# Patient Record
Sex: Female | Born: 1970 | Race: Black or African American | Hispanic: No | Marital: Married | Smoking: Never smoker
Health system: Southern US, Community
[De-identification: ages and names within clinical notes are randomized; demographics above are authoritative.]

## PROBLEM LIST (undated history)

## (undated) DIAGNOSIS — E119 Type 2 diabetes mellitus without complications: Secondary | ICD-10-CM

## (undated) HISTORY — DX: Type 2 diabetes mellitus without complications: E11.9

---

## 2008-05-16 ENCOUNTER — Ambulatory Visit (HOSPITAL_COMMUNITY): Admission: RE | Admit: 2008-05-16 | Discharge: 2008-05-16 | Payer: Self-pay | Admitting: Specialist

## 2008-06-13 ENCOUNTER — Ambulatory Visit (HOSPITAL_COMMUNITY): Admission: RE | Admit: 2008-06-13 | Discharge: 2008-06-13 | Payer: Self-pay | Admitting: Specialist

## 2008-06-24 ENCOUNTER — Ambulatory Visit (HOSPITAL_COMMUNITY): Admission: RE | Admit: 2008-06-24 | Discharge: 2008-06-24 | Payer: Self-pay | Admitting: Specialist

## 2008-07-14 ENCOUNTER — Ambulatory Visit (HOSPITAL_COMMUNITY): Admission: RE | Admit: 2008-07-14 | Discharge: 2008-07-14 | Payer: Self-pay | Admitting: Specialist

## 2008-08-05 ENCOUNTER — Ambulatory Visit (HOSPITAL_COMMUNITY): Admission: RE | Admit: 2008-08-05 | Discharge: 2008-08-05 | Payer: Self-pay | Admitting: Specialist

## 2008-09-06 ENCOUNTER — Ambulatory Visit (HOSPITAL_COMMUNITY): Admission: RE | Admit: 2008-09-06 | Discharge: 2008-09-06 | Payer: Self-pay | Admitting: Specialist

## 2008-09-27 ENCOUNTER — Ambulatory Visit (HOSPITAL_COMMUNITY): Admission: RE | Admit: 2008-09-27 | Discharge: 2008-09-27 | Payer: Self-pay | Admitting: Specialist

## 2008-10-18 ENCOUNTER — Ambulatory Visit (HOSPITAL_COMMUNITY): Admission: RE | Admit: 2008-10-18 | Discharge: 2008-10-18 | Payer: Self-pay | Admitting: Specialist

## 2008-11-08 ENCOUNTER — Ambulatory Visit (HOSPITAL_COMMUNITY): Admission: RE | Admit: 2008-11-08 | Discharge: 2008-11-08 | Payer: Self-pay | Admitting: Specialist

## 2008-11-15 ENCOUNTER — Ambulatory Visit (HOSPITAL_COMMUNITY): Admission: RE | Admit: 2008-11-15 | Discharge: 2008-11-15 | Payer: Self-pay | Admitting: Specialist

## 2010-08-05 ENCOUNTER — Encounter: Payer: Self-pay | Admitting: Specialist

## 2010-11-27 NOTE — Letter (Signed)
June 13, 2008    Linda B. Wiliam Ke, MD  Linda Sandoval, Linda Sandoval.   RE:   Linda Sandoval, Linda Sandoval  MR#   [id number]  ACC#        [id number]   MFM CONSULTATION REPORT   Dear Dr. Wiliam Sandoval,   I had the pleasure of seeing your patient, Linda Sandoval, for Maternal-  Fetal Medicine consultation.  As you are aware, Linda Sandoval is a 40-year-  old gravida 5, para 1-0-3-1 at 15 weeks and 1-day gestation based on  alpha-feto confirmed by 8-week ultrasound.  As you are also aware, Ms.  Sandoval has a history of class B diabetes.  She was diagnosed with  diabetes approximately 9 years ago and reports good control of her blood  sugars prior to pregnancy with Amaryl and Actos.  Since becoming  pregnant, Linda Sandoval has been treated with a combination of glyburide and  insulin.  She has experienced significant hyperglycemic episodes on this  regimen.  She required hospitalization twice as far in the pregnancy.  Linda Sandoval reports initially being treated with glyburide alone 5 mg  twice daily, but stated that her blood sugars were consistently elevated  on this regimen and she was subsequently treated with N and R, was  subsequently treated with NPH insulin 24 units in the morning, 8 units  in the evening, and regular insulin 10 units in the morning and 8 units  in the evening with significant fluctuations in her sugars and again  significant hyperglycemia.   Linda Sandoval denies any other significant medical or surgical history.  She  has discontinued all of her diabetic medications at the present time and  is taking only prenatal vitamins.  She does not smoke, use alcohol, or  street drugs.   Her obstetrical history includes a term spontaneous vaginal delivery in  1992 of an 8 pounds 8 ounce infant.  In 2004, she had elective pregnancy  termination.  In 2007 and again in 2008, she had a first-trimester  spontaneous abortion, neither of these required treatment with D and C.   Linda Sandoval denies a history of abnormal  Pap smears or STDs.  She has a  hemoglobin A1c that was 5.8 on April 29, 2008.  Additionally, her  baseline 24-hour urine is minimally increased at 211 mg per 24 hours.  She had a normal ophthalmologic exam 1 year ago, and is scheduled to  have a baseline EKG.  Her TSH was also within normal limits on June 01, 2008.   Review of systems was performed today and was negative with the  exception that Linda Sandoval reported significant symptoms of depression and  anxiety.  She was tearful during the exam and did report that she had  had suicidal thoughts over the past week.  She reports a history of  episodes of depression in the past, but has never been treated or  received care for these conditions.   On physical exam, Linda Sandoval was tearful.  Her blood pressure was 129/75,  her pulse was 79 beats per minute, and her weight was 233 pounds.   Her blood sugar log was reviewed and since discontinuing all of her  diabetic medications on June 08, 2008, her fasting blood sugars have  ranged from 109-133.  Additionally, her 2-hour post prenatal blood  sugars ranged from 114-209.  She does have some values in the range of  98.  She does have some values that are in the 80s.   Given Linda Sandoval's  significant symptoms of depression today with  accompanying suicidal ideation, hospitalization was recommended.  She  requested that it will be done at her local hospital.  This was  discussed with Dr. Wiliam Sandoval.  As we discussed, she will present today with  her husband for inpatient evaluation of her psychiatric problem.  Given  that her blood sugars remained mildly elevated, we would recommend a  very small dose of glyburide consisting of 2.5 mg each morning to start.  I suspect that some of her low blood sugars may be due to decreased oral  intake due to her depression.  She states she has not eaten anything  today.  She also is interested in dietary consult seeking care further  recommendations on  how to compose and to cut down of her meals.  Discussed began as an inpatient.   Linda Sandoval is also of advanced maternal age and is scheduled back in our  office on June 24, 2008, for ultrasound and possible amniocentesis  for determination of fetal karyotype.   We expect that Linda Sandoval will need ongoing adjustments to her diabetic  medications and glyburide may not suffice with the entire pregnancy.  However, given her current psychiatric symptoms, frequent hyperglycemia,  and poor eating, again a small dose of glyburide may be the best choice  for her at this time.  We would be happy to comanage Linda Sandoval's  diabetes with you and we will reevaluate her blood sugars at her  followup appointment next week.  We can also suggest some outpatient  providers for psychiatric care when she has been stabilized.  We will  make her an appointment with Dr. Sherald Hess, a psychiatrist, in  [facility] for followup when she is discharged.  We have made a  personal appointment with Dr. Sherald Hess in 3-4 weeks for an  outpatient evaluation.  We look forward to seeing Linda Sandoval back.   Thank you for allowing me to participate in her care.  Please feel free  to contact us at any time regarding this or any other patient you may  have.   Sincerely,      Heather L. Rachel Bo, MD  Maternal Fetal Medicine  [facility]     HLM/MEDQ  D:  06/13/2008  T:  06/14/2008  Job:  [id number]

## 2020-11-15 ENCOUNTER — Encounter: Payer: Self-pay | Admitting: Nurse Practitioner

## 2020-11-15 ENCOUNTER — Other Ambulatory Visit: Payer: Self-pay

## 2020-11-15 ENCOUNTER — Ambulatory Visit: Payer: BC Managed Care – PPO | Admitting: Nurse Practitioner

## 2020-11-15 VITALS — BP 118/81 | HR 74 | Ht 67.0 in | Wt 245.0 lb

## 2020-11-15 DIAGNOSIS — Z794 Long term (current) use of insulin: Secondary | ICD-10-CM

## 2020-11-15 DIAGNOSIS — I10 Essential (primary) hypertension: Secondary | ICD-10-CM | POA: Diagnosis not present

## 2020-11-15 DIAGNOSIS — E1165 Type 2 diabetes mellitus with hyperglycemia: Secondary | ICD-10-CM | POA: Diagnosis not present

## 2020-11-15 DIAGNOSIS — E782 Mixed hyperlipidemia: Secondary | ICD-10-CM

## 2020-11-15 LAB — POCT GLYCOSYLATED HEMOGLOBIN (HGB A1C): Hemoglobin A1C: 13 % — AB (ref 4.0–5.6)

## 2020-11-15 MED ORDER — METFORMIN HCL ER 500 MG PO TB24: 1000.0000 mg | ORAL_TABLET | Freq: Every day | ORAL | 3 refills | Status: DC

## 2020-11-15 NOTE — Progress Notes (Signed)
Endocrinology Consult Note       11/15/2020, 4:01 PM   Subjective:    Patient ID: Linda Sandoval, female    DOB: [birth year].  Linda Sandoval is being seen in consultation for management of currently uncontrolled symptomatic diabetes requested by  Aggie Cosieresai, Balaji, MD.   Past Medical History:  Diagnosis Date  . Diabetes mellitus, type II [facility](HCC)     Past Surgical History:  Procedure Laterality Date  . PARTIAL HYSTERECTOMY      Social History   Socioeconomic History  . Marital status: Married    Spouse name: Not on file  . Number of children: Not on file  . Years of education: Not on file  . Highest education level: Not on file  Occupational History  . Not on file  Tobacco Use  . Smoking status: Never Smoker  . Smokeless tobacco: Never Used  Substance and Sexual Activity  . Alcohol use: Never  . Drug use: Never  . Sexual activity: Not on file  Other Topics Concern  . Not on file  Social History Narrative  . Not on file   Social Determinants of Health   Financial Resource Strain: Not on file  Food Insecurity: Not on file  Transportation Needs: Not on file  Physical Activity: Not on file  Stress: Not on file  Social Connections: Not on file    Family History  Problem Relation Age of Onset  . Diabetes Mother   . Hypertension Father   . Cancer Father   . Hypertension Sister     Outpatient Encounter Medications as of 11/15/2020  Medication Sig  . Ascorbic Acid (VITAMIN C PO) Take by mouth daily.  . B Complex Vitamins (VITAMIN B COMPLEX PO) Take by mouth daily.  . metFORMIN (GLUCOPHAGE-XR) 500 MG 24 hr tablet Take 2 tablets (1,000 mg total) by mouth daily with breakfast.  . sitaGLIPtin (JANUVIA) 100 MG tablet Take by mouth daily.  Marland Kitchen. VITAMIN D PO Take by mouth daily.  Marland Kitchen. glimepiride (AMARYL) 2 MG tablet Take 2 mg by mouth every morning.   No facility-administered encounter medications on  file as of 11/15/2020.    ALLERGIES: Allergies  Allergen Reactions  . Penicillins Rash    VACCINATION STATUS:  There is no immunization history on file for this patient.  Diabetes She presents for her initial diabetic visit. She has type 2 diabetes mellitus. Onset time: Was diagnosed at approx age of 50. Her disease course has been worsening. There are no hypoglycemic associated symptoms. Associated symptoms include blurred vision, fatigue, polydipsia, polyphagia and polyuria. There are no hypoglycemic complications. Symptoms are worsening. There are no diabetic complications. Risk factors for coronary artery disease include diabetes mellitus, dyslipidemia, family history, obesity, hypertension and sedentary lifestyle. Current diabetic treatment includes oral agent (dual therapy). She is compliant with treatment most of the time. Her weight is stable. She is following a generally unhealthy diet. When asked about meal planning, she reported none. She has not had a previous visit with a dietitian. She rarely participates in exercise. (She presents today for her consultation with no meter or logs to review.  Her POCT A1c today is 13%.  She reports she  intermittently monitors glucose 1-2 times daily but she knows she does not check it as often as she should.  She admits to drinking sugar free water additives and does not have a routine eating pattern.  She admits to frequent snacking and does not engage in routine physical activity.  She reports she has been under great amount of stress lately.  She denies any s/s of hypoglycemia.) An ACE inhibitor/angiotensin II receptor blocker is not being taken. She does not see a podiatrist.Eye exam is current.     Review of systems  Constitutional: + Minimally fluctuating body weight,  current Body mass index is 38.37 kg/m. , + fatigue, no subjective hyperthermia, no subjective hypothermia Eyes: + blurry vision, no xerophthalmia ENT: no sore throat, no nodules  palpated in throat, no dysphagia/odynophagia, no hoarseness Cardiovascular: no chest pain, no shortness of breath, no palpitations, no leg swelling Respiratory: no cough, no shortness of breath Gastrointestinal: no nausea/vomiting/diarrhea Genitourinary: + polyuria Musculoskeletal: no muscle/joint aches Skin: no rashes, no hyperemia Neurological: no tremors, no numbness, no tingling, no dizziness Psychiatric: no depression, no anxiety  Objective:     BP 118/81   Pulse 74   Ht 5\' 7"  (1.702 m)   Wt 245 lb (111.1 kg)   BMI 38.37 kg/m   Wt Readings from Last 3 Encounters:  11/15/20 245 lb (111.1 kg)     BP Readings from Last 3 Encounters:  11/15/20 118/81     Physical Exam- Limited  Constitutional:  Body mass index is 38.37 kg/m. , not in acute distress, normal state of mind Eyes:  EOMI, no exophthalmos Neck: Supple Respiratory: Adequate breathing efforts Musculoskeletal: no gross deformities, strength intact in all four extremities, no gross restriction of joint movements Skin:  no rashes, no hyperemia Neurological: no tremor with outstretched hands    CMP ( most recent) CMP  No results found for: NA, K, CL, CO2, GLUCOSE, BUN, CREATININE, CALCIUM, PROT, ALBUMIN, AST, ALT, ALKPHOS, BILITOT, GFRNONAA, GFRAA   Diabetic Labs (most recent): Lab Results  Component Value Date   HGBA1C 13.0 (A) 11/15/2020     Lipid Panel ( most recent) Lipid Panel  No results found for: CHOL, TRIG, HDL, CHOLHDL, VLDL, LDLCALC, LDLDIRECT, LABVLDL    No results found for: TSH, FREET4         Assessment & Plan:   1) Type 2 diabetes mellitus with hyperglycemia, with long-term current use of insulin (HCC)  She presents today for her consultation with no meter or logs to review.  Her POCT A1c today is 13%.  She reports she intermittently monitors glucose 1-2 times daily but she knows she does not check it as often as she should.  She admits to drinking sugar free water additives and  does not have a routine eating pattern.  She admits to frequent snacking and does not engage in routine physical activity.  She reports she has been under great amount of stress lately.  She denies any s/s of hypoglycemia.  - Linda Sandoval has currently uncontrolled symptomatic type 2 DM since 50 years of age, with most recent A1c of 13 %.   -Recent labs reviewed.  - I had a long discussion with her about the progressive nature of diabetes and the pathology behind its complications. -her diabetes is not currently complicated but she remains at a high risk for more acute and chronic complications which include CAD, CVA, CKD, retinopathy, and neuropathy. These are all discussed in detail with her.  - I  have counseled her on diet  and weight management by adopting a carbohydrate restricted/protein rich diet. Patient is encouraged to switch to unprocessed or minimally processed complex starch and increased protein intake (animal or plant source), fruits, and vegetables. -  she is advised to stick to a routine mealtimes to eat 3 meals a day and avoid unnecessary snacks (to snack only to correct hypoglycemia).   - she acknowledges that there is a room for improvement in her food and drink choices. - Suggestion is made for her to avoid simple carbohydrates  from her diet including Cakes, Sweet Desserts, Ice Cream, Soda (diet and regular), Sweet Tea, Candies, Chips, Cookies, Store Bought Juices, Alcohol in Excess of  1-2 drinks a day, Artificial Sweeteners, Coffee Creamer, and "Sugar-free" Products. This will help patient to have more stable blood glucose profile and potentially avoid unintended weight gain.  - she will be scheduled with Norm Salt, RDN, CDE for diabetes education.  - I have approached her with the following individualized plan to manage  her diabetes and patient agrees:   -she is encouraged to start monitoring glucose 4 times daily, before meals and before bed, to log their  readings on the clinic sheets provided, and bring them to review at follow up appointment in 2 weeks.  - she is warned not to take insulin without proper monitoring per orders. - Adjustment parameters are given to her for hypo and hyperglycemia in writing. - she is encouraged to call clinic for blood glucose levels less than 70 or above 300 mg /dl. - she is advised to continue Januvia 100 mg po daily, therapeutically suitable for patient. -I discussed and increased her Glimepiride to 4 mg po daily (may take 2 of her 2 mg tabs until she depletes her supply).  -I also discussed and reinitiated Metformin 1000 mg ER daily after breakfast.  She was unable to tolerate regular strength form previously.   - she will be considered for change in incretin therapy to injectables as appropriate next visit.  - Specific targets for  A1c;  LDL, HDL,  and Triglycerides were discussed with the patient.  2) Blood Pressure /Hypertension:  her blood pressure is controlled to target without the use of antihypertensive medications.    3) Lipids/Hyperlipidemia:    There is no recent lipid panel to review.  Will recheck lipid panel on subsequent visits.  She is not currently on any lipid lowering agents at this time.    4)  Weight/Diet:  her Body mass index is 38.37 kg/m.  -  clearly complicating her diabetes care.   she is a candidate for weight loss. I discussed with her the fact that loss of 5 - 10% of her  current body weight will have the most impact on her diabetes management.  Exercise, and detailed carbohydrates information provided  -  detailed on discharge instructions.  5) Chronic Care/Health Maintenance: -she is not on ACEI/ARB or Statin medications and is encouraged to initiate and continue to follow up with Ophthalmology, Dentist, Podiatrist at least yearly or according to recommendations, and advised to stay away from smoking. I have recommended yearly flu vaccine and pneumonia vaccine at least every  5 years; moderate intensity exercise for up to 150 minutes weekly; and sleep for at least 7 hours a day.  - she is advised to maintain close follow up with Aggie Cosier, MD for primary care needs, as well as her other providers for optimal and coordinated care.   - Time  spent in this patient care: 60 min, of which > 50% was spent in counseling her about her diabetes and the rest reviewing her blood glucose logs, discussing her hypoglycemia and hyperglycemia episodes, reviewing her current and previous labs/studies (including abstraction from other facilities) and medications doses and developing a long term treatment plan based on the latest standards of care/guidelines; and documenting her care.    Please refer to Patient Instructions for Blood Glucose Monitoring and Insulin/Medications Dosing Guide" in media tab for additional information. Please also refer to "Patient Self Inventory" in the Media tab for reviewed elements of pertinent patient history.  Linda Sandoval participated in the discussions, expressed understanding, and voiced agreement with the above plans.  All questions were answered to her satisfaction. she is encouraged to contact clinic should she have any questions or concerns prior to her return visit.   Follow up plan: - Return in about 2 weeks (around 11/29/2020) for Diabetes F/U, Bring meter and logs, ABI next visit.  Ronny Bacon, [facility] [address] [place], [place] [address] Phone: [phone] Fax: [phone]  11/15/2020, 4:01 PM

## 2020-11-15 NOTE — Patient Instructions (Signed)
Diabetes Mellitus and Nutrition, Adult When you have diabetes, or diabetes mellitus, it is very important to have healthy eating habits because your blood sugar (glucose) levels are greatly affected by what you eat and drink. Eating healthy foods in the right amounts, at about the same times every day, can help you:  Control your blood glucose.  Lower your risk of heart disease.  Improve your blood pressure.  Reach or maintain a healthy weight. What can affect my meal plan? Every person with diabetes is different, and each person has different needs for a meal plan. Your health care provider may recommend that you work with a dietitian to make a meal plan that is best for you. Your meal plan may vary depending on factors such as:  The calories you need.  The medicines you take.  Your weight.  Your blood glucose, blood pressure, and cholesterol levels.  Your activity level.  Other health conditions you have, such as heart or kidney disease. How do carbohydrates affect me? Carbohydrates, also called carbs, affect your blood glucose level more than any other type of food. Eating carbs naturally raises the amount of glucose in your blood. Carb counting is a method for keeping track of how many carbs you eat. Counting carbs is important to keep your blood glucose at a healthy level, especially if you use insulin or take certain oral diabetes medicines. It is important to know how many carbs you can safely have in each meal. This is different for every person. Your dietitian can help you calculate how many carbs you should have at each meal and for each snack. How does alcohol affect me? Alcohol can cause a sudden decrease in blood glucose (hypoglycemia), especially if you use insulin or take certain oral diabetes medicines. Hypoglycemia can be a life-threatening condition. Symptoms of hypoglycemia, such as sleepiness, dizziness, and confusion, are similar to symptoms of having too much  alcohol.  Do not drink alcohol if: ? Your health care provider tells you not to drink. ? You are pregnant, may be pregnant, or are planning to become pregnant.  If you drink alcohol: ? Do not drink on an empty stomach. ? Limit how much you use to:  0-1 drink a day for women.  0-2 drinks a day for men. ? Be aware of how much alcohol is in your drink. In the [place] one drink equals one 12 oz bottle of beer (355 mL), one 5 oz glass of wine (148 mL), or one 1 oz glass of hard liquor (44 mL). ? Keep yourself hydrated with water, diet soda, or unsweetened iced tea.  Keep in mind that regular soda, juice, and other mixers may contain a lot of sugar and must be counted as carbs. What are tips for following this plan? Reading food labels  Start by checking the serving size on the "Nutrition Facts" label of packaged foods and drinks. The amount of calories, carbs, fats, and other nutrients listed on the label is based on one serving of the item. Many items contain more than one serving per package.  Check the total grams (g) of carbs in one serving. You can calculate the number of servings of carbs in one serving by dividing the total carbs by 15. For example, if a food has 30 g of total carbs per serving, it would be equal to 2 servings of carbs.  Check the number of grams (g) of saturated fats and trans fats in one serving. Choose foods that have   a low amount or none of these fats.  Check the number of milligrams (mg) of salt (sodium) in one serving. Most people should limit total sodium intake to less than 2,300 mg per day.  Always check the nutrition information of foods labeled as "low-fat" or "nonfat." These foods may be higher in added sugar or refined carbs and should be avoided.  Talk to your dietitian to identify your daily goals for nutrients listed on the label. Shopping  Avoid buying canned, pre-made, or processed foods. These foods tend to be high in fat, sodium, and added  sugar.  Shop around the outside edge of the grocery store. This is where you will most often find fresh fruits and vegetables, bulk grains, fresh meats, and fresh dairy. Cooking  Use low-heat cooking methods, such as baking, instead of high-heat cooking methods like deep frying.  Cook using healthy oils, such as olive, canola, or sunflower oil.  Avoid cooking with butter, cream, or high-fat meats. Meal planning  Eat meals and snacks regularly, preferably at the same times every day. Avoid going long periods of time without eating.  Eat foods that are high in fiber, such as fresh fruits, vegetables, beans, and whole grains. Talk with your dietitian about how many servings of carbs you can eat at each meal.  Eat 4-6 oz (112-168 g) of lean protein each day, such as lean meat, chicken, fish, eggs, or tofu. One ounce (oz) of lean protein is equal to: ? 1 oz (28 g) of meat, chicken, or fish. ? 1 egg. ?  cup (62 g) of tofu.  Eat some foods each day that contain healthy fats, such as avocado, nuts, seeds, and fish.   What foods should I eat? Fruits Berries. Apples. Oranges. Peaches. Apricots. Plums. Grapes. Mango. Papaya. Pomegranate. Kiwi. Cherries. Vegetables Lettuce. Spinach. Leafy greens, including kale, chard, collard greens, and mustard greens. Beets. Cauliflower. Cabbage. Broccoli. Carrots. Green beans. Tomatoes. Peppers. Onions. Cucumbers. Brussels sprouts. Grains Whole grains, such as whole-wheat or whole-grain bread, crackers, tortillas, cereal, and pasta. Unsweetened oatmeal. Quinoa. Brown or wild rice. Meats and other proteins Seafood. Poultry without skin. Lean cuts of poultry and beef. Tofu. Nuts. Seeds. Dairy Low-fat or fat-free dairy products such as milk, yogurt, and cheese. The items listed above may not be a complete list of foods and beverages you can eat. Contact a dietitian for more information. What foods should I avoid? Fruits Fruits canned with  syrup. Vegetables Canned vegetables. Frozen vegetables with butter or cream sauce. Grains Refined white flour and flour products such as bread, pasta, snack foods, and cereals. Avoid all processed foods. Meats and other proteins Fatty cuts of meat. Poultry with skin. Breaded or fried meats. Processed meat. Avoid saturated fats. Dairy Full-fat yogurt, cheese, or milk. Beverages Sweetened drinks, such as soda or iced tea. The items listed above may not be a complete list of foods and beverages you should avoid. Contact a dietitian for more information. Questions to ask a health care provider  Do I need to meet with a diabetes educator?  Do I need to meet with a dietitian?  What number can I call if I have questions?  When are the best times to check my blood glucose? Where to find more information:  American Diabetes Association: diabetes.org  Academy of Nutrition and Dietetics: www.eatright.org  National Institute of Diabetes and Digestive and Kidney Diseases: www.niddk.nih.gov  Association of Diabetes Care and Education Specialists: www.diabeteseducator.org Summary  It is important to have healthy eating   habits because your blood sugar (glucose) levels are greatly affected by what you eat and drink.  A healthy meal plan will help you control your blood glucose and maintain a healthy lifestyle.  Your health care provider may recommend that you work with a dietitian to make a meal plan that is best for you.  Keep in mind that carbohydrates (carbs) and alcohol have immediate effects on your blood glucose levels. It is important to count carbs and to use alcohol carefully. This information is not intended to replace advice given to you by your health care provider. Make sure you discuss any questions you have with your health care provider. Document Revised: 06/08/2019 Document Reviewed: 06/08/2019 Elsevier Patient Education  2021 Elsevier Inc.  

## 2020-11-30 ENCOUNTER — Ambulatory Visit: Payer: BC Managed Care – PPO | Admitting: Nurse Practitioner

## 2020-12-04 ENCOUNTER — Other Ambulatory Visit: Payer: Self-pay

## 2020-12-04 ENCOUNTER — Ambulatory Visit: Payer: BC Managed Care – PPO | Admitting: Nurse Practitioner

## 2020-12-04 ENCOUNTER — Encounter: Payer: Self-pay | Admitting: Nurse Practitioner

## 2020-12-04 VITALS — BP 146/84 | HR 84 | Ht 67.0 in | Wt 247.0 lb

## 2020-12-04 DIAGNOSIS — E1165 Type 2 diabetes mellitus with hyperglycemia: Secondary | ICD-10-CM | POA: Diagnosis not present

## 2020-12-04 DIAGNOSIS — Z794 Long term (current) use of insulin: Secondary | ICD-10-CM

## 2020-12-04 DIAGNOSIS — E559 Vitamin D deficiency, unspecified: Secondary | ICD-10-CM | POA: Diagnosis not present

## 2020-12-04 DIAGNOSIS — I10 Essential (primary) hypertension: Secondary | ICD-10-CM

## 2020-12-04 DIAGNOSIS — E782 Mixed hyperlipidemia: Secondary | ICD-10-CM

## 2020-12-04 LAB — POCT UA - MICROALBUMIN
Albumin/Creatinine Ratio, Urine, POC: <30
Creatinine, POC: 100 mg/dL
Microalbumin Ur, POC: 10 mg/L

## 2020-12-04 MED ORDER — GLIPIZIDE ER 5 MG PO TB24: 5.0000 mg | ORAL_TABLET | Freq: Every day | ORAL | 3 refills | Status: DC

## 2020-12-04 NOTE — Patient Instructions (Signed)

## 2020-12-04 NOTE — Progress Notes (Signed)
Endocrinology Follow Up Note       12/04/2020, 4:24 PM   Subjective:    Patient ID: Linda Sandoval, female    DOB: [birth year].  Linda Sandoval is being seen in follow up after being seen in consultation for management of currently uncontrolled symptomatic diabetes requested by  Aggie Cosier, MD.   Past Medical History:  Diagnosis Date  . Diabetes mellitus, type II [facility])     Past Surgical History:  Procedure Laterality Date  . PARTIAL HYSTERECTOMY      Social History   Socioeconomic History  . Marital status: Married    Spouse name: Not on file  . Number of children: Not on file  . Years of education: Not on file  . Highest education level: Not on file  Occupational History  . Not on file  Tobacco Use  . Smoking status: Never Smoker  . Smokeless tobacco: Never Used  Vaping Use  . Vaping Use: Never used  Substance and Sexual Activity  . Alcohol use: Never  . Drug use: Never  . Sexual activity: Not on file  Other Topics Concern  . Not on file  Social History Narrative  . Not on file   Social Determinants of Health   Financial Resource Strain: Not on file  Food Insecurity: Not on file  Transportation Needs: Not on file  Physical Activity: Not on file  Stress: Not on file  Social Connections: Not on file    Family History  Problem Relation Age of Onset  . Diabetes Mother   . Hypertension Father   . Cancer Father   . Hypertension Sister     Outpatient Encounter Medications as of 12/04/2020  Medication Sig  . Ascorbic Acid (VITAMIN C PO) Take by mouth daily.  . B Complex Vitamins (VITAMIN B COMPLEX PO) Take by mouth daily.  Marland Kitchen glimepiride (AMARYL) 2 MG tablet Take 2 mg by mouth every morning.  . metFORMIN (GLUCOPHAGE-XR) 500 MG 24 hr tablet Take 2 tablets (1,000 mg total) by mouth daily with breakfast.  . sitaGLIPtin (JANUVIA) 100 MG tablet Take by mouth daily.  Marland Kitchen VITAMIN D PO  Take by mouth daily.   No facility-administered encounter medications on file as of 12/04/2020.    ALLERGIES: Allergies  Allergen Reactions  . Penicillins Rash    VACCINATION STATUS:  There is no immunization history on file for this patient.  Diabetes She presents for her follow-up diabetic visit. She has type 2 diabetes mellitus. Onset time: Was diagnosed at approx age of 50. Her disease course has been improving. There are no hypoglycemic associated symptoms. Associated symptoms include blurred vision, fatigue, polydipsia, polyphagia and polyuria. There are no hypoglycemic complications. Symptoms are improving. There are no diabetic complications. Risk factors for coronary artery disease include diabetes mellitus, dyslipidemia, family history, obesity, hypertension and sedentary lifestyle. Current diabetic treatment includes oral agent (triple therapy). She is compliant with treatment most of the time. Her weight is stable. She is following a generally unhealthy diet. When asked about meal planning, she reported none. She has not had a previous visit with a dietitian. She rarely participates in exercise. Her home blood glucose trend is decreasing steadily.  Her breakfast blood glucose range is generally 140-180 mg/dl. Her lunch blood glucose range is generally 130-140 mg/dl. Her dinner blood glucose range is generally 140-180 mg/dl. Her bedtime blood glucose range is generally 180-200 mg/dl. (She presents today with her meter and logs showing improved, yet still above target fasting and postprandial glycemic profile.  She has tolerated the addition of ER Metformin well.  She did have drop in glucose once, associated with skipping lunch.) An ACE inhibitor/angiotensin II receptor blocker is not being taken. She does not see a podiatrist.Eye exam is current.     Review of systems  Constitutional: + Minimally fluctuating body weight,  current Body mass index is 38.69 kg/m. , + fatigue-improving,  no subjective hyperthermia, no subjective hypothermia Eyes: + blurry vision, no xerophthalmia ENT: no sore throat, no nodules palpated in throat, no dysphagia/odynophagia, no hoarseness Cardiovascular: no chest pain, no shortness of breath, no palpitations, no leg swelling Respiratory: no cough, no shortness of breath Gastrointestinal: no nausea/vomiting/diarrhea Genitourinary: + polyuria-improving Musculoskeletal: no muscle/joint aches Skin: no rashes, no hyperemia Neurological: no tremors, no numbness, no tingling, no dizziness Psychiatric: no depression, no anxiety  Objective:     BP (!) 146/84   Pulse 84   Ht 5\' 7"  (1.702 m)   Wt 247 lb (112 kg)   BMI 38.69 kg/m   Wt Readings from Last 3 Encounters:  12/04/20 247 lb (112 kg)  11/15/20 245 lb (111.1 kg)     BP Readings from Last 3 Encounters:  12/04/20 (!) 146/84  11/15/20 118/81     Physical Exam- Limited  Constitutional:  Body mass index is 38.69 kg/m. , not in acute distress, normal state of mind Eyes:  EOMI, no exophthalmos Neck: Supple Respiratory: Adequate breathing efforts Musculoskeletal: no gross deformities, strength intact in all four extremities, no gross restriction of joint movements Skin:  no rashes, no hyperemia Neurological: no tremor with outstretched hands   POCT ABI Results 12/04/20   Right ABI:  1.11      Left ABI:  1.08  Right leg systolic / diastolic: 162/87 mmHg Left leg systolic / diastolic: 157/90 mmHg  Arm systolic / diastolic: 146/84 mmHG  Detailed report will be scanned into patient chart.    CMP ( most recent) CMP  No results found for: NA, K, CL, CO2, GLUCOSE, BUN, CREATININE, CALCIUM, PROT, ALBUMIN, AST, ALT, ALKPHOS, BILITOT, GFRNONAA, GFRAA   Diabetic Labs (most recent): Lab Results  Component Value Date   HGBA1C 13.0 (A) 11/15/2020     Lipid Panel ( most recent) Lipid Panel  No results found for: CHOL, TRIG, HDL, CHOLHDL, VLDL, LDLCALC, LDLDIRECT, LABVLDL     No results found for: TSH, FREET4         Assessment & Plan:   1) Type 2 diabetes mellitus with hyperglycemia, with long-term current use of insulin (HCC)  She presents today with her meter and logs showing improved, yet still above target fasting and postprandial glycemic profile.  She has tolerated the addition of ER Metformin well.  She did have drop in glucose once, associated with skipping lunch.  - Carman F Baze has currently uncontrolled symptomatic type 2 DM since 50 years of age, with most recent A1c of 13 %.   -Recent labs reviewed.  - I had a long discussion with her about the progressive nature of diabetes and the pathology behind its complications. -her diabetes is not currently complicated but she remains at a high risk for more acute and chronic complications  which include CAD, CVA, CKD, retinopathy, and neuropathy. These are all discussed in detail with her.  - Nutritional counseling repeated at each appointment due to patients tendency to fall back in to old habits.  - The patient admits there is a room for improvement in their diet and drink choices. -  Suggestion is made for the patient to avoid simple carbohydrates from their diet including Cakes, Sweet Desserts / Pastries, Ice Cream, Soda (diet and regular), Sweet Tea, Candies, Chips, Cookies, Sweet Pastries, Store Bought Juices, Alcohol in Excess of 1-2 drinks a day, Artificial Sweeteners, Coffee Creamer, and "Sugar-free" Products. This will help patient to have stable blood glucose profile and potentially avoid unintended weight gain.   - I encouraged the patient to switch to unprocessed or minimally processed complex starch and increased protein intake (animal or plant source), fruits, and vegetables.   - Patient is advised to stick to a routine mealtimes to eat 3 meals a day and avoid unnecessary snacks (to snack only to correct hypoglycemia).  - she will be scheduled with Norm Salt, RDN, CDE for  diabetes education.  - I have approached her with the following individualized plan to manage  her diabetes and patient agrees:   -she is encouraged to continue monitoring blood glucose twice daily, before breakfast and before bed, and to call the clinic if she has readings less than 70 or greater than 300 for 3 tests in a row.  - she is warned not to take insulin without proper monitoring per orders. - Adjustment parameters are given to her for hypo and hyperglycemia in writing.  - she is advised to continue Januvia 100 mg po daily and Glimepiride 4 mg po daily, therapeutically suitable for patient (she is about to run out of Glimepiride and will be switched to Glipizide 5 mg XL daily with breakfast).  -She has tolerated ER Metformin well, she is advised to continue Metformin 1000 mg ER daily with breakfast.   - she will be considered for change in incretin therapy to injectables as appropriate next visit.  - Specific targets for  A1c;  LDL, HDL,  and Triglycerides were discussed with the patient.  2) Blood Pressure /Hypertension:  her blood pressure is not controlled to target today.  She is not on any antihypertensive medications.  She will be considered for low dose ACE/ARB on subsequent visits if BP remains elevated on 3 separate occasions.    3) Lipids/Hyperlipidemia:    There is no recent lipid panel to review.  She is not currently on any lipid lowering agents at this time.  Will recheck lipid panel prior to next visit.  4)  Weight/Diet:  her Body mass index is 38.69 kg/m.  -  clearly complicating her diabetes care.   she is a candidate for weight loss. I discussed with her the fact that loss of 5 - 10% of her  current body weight will have the most impact on her diabetes management.  Exercise, and detailed carbohydrates information provided  -  detailed on discharge instructions.  5) Chronic Care/Health Maintenance: -she is not on ACEI/ARB or Statin medications and is encouraged  to initiate and continue to follow up with Ophthalmology, Dentist, Podiatrist at least yearly or according to recommendations, and advised to stay away from smoking. I have recommended yearly flu vaccine and pneumonia vaccine at least every 5 years; moderate intensity exercise for up to 150 minutes weekly; and sleep for at least 7 hours a day.  - she  is advised to maintain close follow up with Aggie Cosier, MD for primary care needs, as well as her other providers for optimal and coordinated care.     I spent 38 minutes in the care of the patient today including review of labs from CMP, Lipids, Thyroid Function, Hematology (current and previous including abstractions from other facilities); face-to-face time discussing  her blood glucose readings/logs, discussing hypoglycemia and hyperglycemia episodes and symptoms, medications doses, her options of short and long term treatment based on the latest standards of care / guidelines;  discussion about incorporating lifestyle medicine;  and documenting the encounter.    Please refer to Patient Instructions for Blood Glucose Monitoring and Insulin/Medications Dosing Guide"  in media tab for additional information. Please  also refer to " Patient Self Inventory" in the Media  tab for reviewed elements of pertinent patient history.  Lizbeth F Dantuono participated in the discussions, expressed understanding, and voiced agreement with the above plans.  All questions were answered to her satisfaction. she is encouraged to contact clinic should she have any questions or concerns prior to her return visit.   Follow up plan: - Return in about 3 months (around 03/06/2021) for Diabetes F/U with A1c in office, Previsit labs, Bring meter and logs.  Ronny Bacon, [facility] [address] [place], [place] [address] Phone: [phone] Fax: [phone]  12/04/2020, 4:24 PM

## 2020-12-21 ENCOUNTER — Telehealth: Payer: Self-pay | Admitting: Nurse Practitioner

## 2020-12-21 MED ORDER — TRULICITY 1.5 MG/0.5ML ~~LOC~~ SOAJ: 1.5000 mg | SUBCUTANEOUS | 3 refills | Status: DC

## 2020-12-21 NOTE — Telephone Encounter (Signed)
Informed pt .

## 2020-12-21 NOTE — Telephone Encounter (Signed)
Pt is calling and states she was supposed to let us know when she finished her Januvia so that a weekly injection medication could be called in, she is unsure what Alphonzo Lemmings was going to put her on but she has finished the Januvia.   [facility] [address] [place], [place] - [place] [address] [phone] Phone:  [phone]  Fax:  [phone]

## 2021-01-08 ENCOUNTER — Other Ambulatory Visit: Payer: Self-pay

## 2021-01-08 ENCOUNTER — Encounter: Payer: BC Managed Care – PPO | Attending: Nurse Practitioner | Admitting: Nutrition

## 2021-01-08 DIAGNOSIS — IMO0002 Reserved for concepts with insufficient information to code with codable children: Secondary | ICD-10-CM

## 2021-01-08 DIAGNOSIS — E1165 Type 2 diabetes mellitus with hyperglycemia: Secondary | ICD-10-CM | POA: Insufficient documentation

## 2021-01-08 DIAGNOSIS — E118 Type 2 diabetes mellitus with unspecified complications: Secondary | ICD-10-CM | POA: Insufficient documentation

## 2021-01-08 NOTE — Patient Instructions (Addendum)
Goals  Work on meal prepping Cut out snacks Drink 100 oz 6-16 oz bottles  Increase fresh fruits and vegetables. Get AM less than 130 and less than 150 at bedtime. Take medication as prescribed.

## 2021-01-08 NOTE — Progress Notes (Signed)
Medical Nutrition Therapy  Appointment Start time:  [phone]  Appointment End time:  1645  Primary concerns today: Dm Type 2  Referral diagnosis: E11.8, E66.9 Preferred learning style: No preferece. Learning readiness: Ready   NUTRITION ASSESSMENT  Struggles to eat late at night due to schedule.  Anthropometrics  Wt Readings from Last 3 Encounters:  01/08/21 (P) 244 lb (110.7 kg)  12/04/20 247 lb (112 kg)  11/15/20 245 lb (111.1 kg)   Ht Readings from Last 3 Encounters:  01/08/21 (P) 5\' 7"  (1.702 m)  12/04/20 5\' 7"  (1.702 m)  11/15/20 5\' 7"  (1.702 m)   Body mass index is 38.22 kg/m (pended). @BMIFA @ Facility age limit for growth percentiles is 20 years. Facility age limit for growth percentiles is 20 years.    Clinical Medical Hx: Dm Type 2 Medications: Trulicity, Glipizide, Metformin ER 500 mg BID Labs:  Lab Results  Component Value Date   HGBA1C 13.0 (A) 11/15/2020   Notable Signs/Symptoms: Increase thirst, fatigue, increased urination, dry mouth  Lifestyle & Dietary Hx   Estimated daily fluid intake: 32 oz Supplements: VIt C, Vit D Sleep: varieis Stress / self-care: life Current average weekly physical activity: ADL  24-Hr Dietary Recall Eats 1-2 meals per eay  Estimated Energy Needs Calories: 1200 Carbohydrate: 135g Protein: 90g Fat: 33g   NUTRITION DIAGNOSIS  NB-1.1 Food and nutrition-related knowledge deficit As related to Diabetes Type .  As evidenced by A1C 13.8%.   NUTRITION INTERVENTION  Nutrition education (E-1) on the following topics:  Nutrition and Diabetes education provided on My Plate, CHO counting, meal planning, portion sizes, timing of meals, avoiding snacks between meals unless having a low blood sugar, target ranges for A1C and blood sugars, signs/symptoms and treatment of hyper/hypoglycemia, monitoring blood sugars, taking medications as prescribed, benefits of exercising 30 minutes per day and prevention of complications of  DM.   Handouts Provided Include  MyPlate Diabetes Instructions Meal Plan Card  Learning Style & Readiness for Change Teaching method utilized: Visual & Auditory  Demonstrated degree of understanding via: Teach Back  Barriers to learning/adherence to lifestyle change: none  Goals Established by Pt Goals  Work on meal prepping Cut out snacks Drink 100 oz 6-16 oz bottles  Increase fresh fruits and vegetables. Get AM less than 130 and less than 150 at bedtime. Take medication as prescribed.01/15/21   MONITORING & EVALUATION Dietary intake, weekly physical activity, and blood sugars  in 1 month.  Next Steps  Patient is to work on meal planning and eating meals on time. 

## 2021-01-10 ENCOUNTER — Encounter: Payer: Self-pay | Admitting: Nutrition

## 2021-02-08 ENCOUNTER — Ambulatory Visit: Payer: BC Managed Care – PPO | Admitting: Nutrition

## 2021-02-21 ENCOUNTER — Encounter: Payer: BC Managed Care – PPO | Admitting: Nutrition

## 2021-03-07 ENCOUNTER — Ambulatory Visit: Payer: BC Managed Care – PPO | Admitting: Nurse Practitioner

## 2021-03-07 ENCOUNTER — Ambulatory Visit: Payer: BC Managed Care – PPO | Admitting: Nutrition

## 2021-05-26 LAB — COMPREHENSIVE METABOLIC PANEL
ALT: 20 IU/L (ref 0–32)
AST: 24 IU/L (ref 0–40)
Albumin/Globulin Ratio: 1.2 (ref 1.2–2.2)
Albumin: 4 g/dL (ref 3.8–4.8)
Alkaline Phosphatase: 115 IU/L (ref 44–121)
BUN/Creatinine Ratio: 14 (ref 9–23)
BUN: 9 mg/dL (ref 6–24)
Bilirubin Total: 0.4 mg/dL (ref 0.0–1.2)
CO2: 25 mmol/L (ref 20–29)
Calcium: 10 mg/dL (ref 8.7–10.2)
Chloride: 97 mmol/L (ref 96–106)
Creatinine, Ser: 0.66 mg/dL (ref 0.57–1.00)
Globulin, Total: 3.3 g/dL (ref 1.5–4.5)
Glucose: 172 mg/dL — ABNORMAL HIGH (ref 70–99)
Potassium: 5 mmol/L (ref 3.5–5.2)
Sodium: 134 mmol/L (ref 134–144)
Total Protein: 7.3 g/dL (ref 6.0–8.5)
eGFR: 108 mL/min/{1.73_m2} (ref >59)

## 2021-05-26 LAB — LIPID PANEL
Chol/HDL Ratio: 1.6 ratio (ref 0.0–4.4)
Cholesterol, Total: 121 mg/dL (ref 100–199)
HDL: 76 mg/dL (ref >39)
LDL Chol Calc (NIH): 30 mg/dL (ref 0–99)
Triglycerides: 76 mg/dL (ref 0–149)
VLDL Cholesterol Cal: 15 mg/dL (ref 5–40)

## 2021-05-26 LAB — T4, FREE: Free T4: 1.18 ng/dL (ref 0.82–1.77)

## 2021-05-26 LAB — TSH: TSH: 1.07 u[IU]/mL (ref 0.450–4.500)

## 2021-05-26 LAB — VITAMIN D 25 HYDROXY (VIT D DEFICIENCY, FRACTURES): Vit D, 25-Hydroxy: 58.9 ng/mL (ref 30.0–100.0)

## 2021-06-11 ENCOUNTER — Ambulatory Visit: Payer: BC Managed Care – PPO | Admitting: Nurse Practitioner

## 2021-06-13 NOTE — Patient Instructions (Signed)

## 2021-06-14 ENCOUNTER — Ambulatory Visit: Payer: BC Managed Care – PPO | Admitting: Nurse Practitioner

## 2021-06-14 ENCOUNTER — Encounter: Payer: Self-pay | Admitting: Nurse Practitioner

## 2021-06-14 ENCOUNTER — Other Ambulatory Visit: Payer: Self-pay | Admitting: Nurse Practitioner

## 2021-06-14 ENCOUNTER — Other Ambulatory Visit: Payer: Self-pay

## 2021-06-14 VITALS — BP 132/80 | HR 88 | Ht 67.0 in | Wt 237.0 lb

## 2021-06-14 DIAGNOSIS — Z794 Long term (current) use of insulin: Secondary | ICD-10-CM

## 2021-06-14 DIAGNOSIS — I10 Essential (primary) hypertension: Secondary | ICD-10-CM

## 2021-06-14 DIAGNOSIS — E559 Vitamin D deficiency, unspecified: Secondary | ICD-10-CM

## 2021-06-14 DIAGNOSIS — E1165 Type 2 diabetes mellitus with hyperglycemia: Secondary | ICD-10-CM

## 2021-06-14 DIAGNOSIS — E782 Mixed hyperlipidemia: Secondary | ICD-10-CM | POA: Diagnosis not present

## 2021-06-14 LAB — POCT GLYCOSYLATED HEMOGLOBIN (HGB A1C): HbA1c POC (<> result, manual entry): 11.1 % (ref 4.0–5.6)

## 2021-06-14 NOTE — Progress Notes (Signed)
Endocrinology Follow Up Note       06/14/2021, 9:25 AM   Subjective:    Patient ID: Linda Sandoval, female    DOB: 04-18-1971.  Linda Sandoval is being seen in follow up after being seen in consultation for management of currently uncontrolled symptomatic diabetes requested by  Aggie Cosier, MD.   Past Medical History:  Diagnosis Date   Diabetes mellitus, type II (HCC)     Past Surgical History:  Procedure Laterality Date   PARTIAL HYSTERECTOMY      Social History   Socioeconomic History   Marital status: Married    Spouse name: Not on file   Number of children: Not on file   Years of education: Not on file   Highest education level: Not on file  Occupational History   Not on file  Tobacco Use   Smoking status: Never   Smokeless tobacco: Never  Vaping Use   Vaping Use: Never used  Substance and Sexual Activity   Alcohol use: Never   Drug use: Never   Sexual activity: Not on file  Other Topics Concern   Not on file  Social History Narrative   Not on file   Social Determinants of Health   Financial Resource Strain: Not on file  Food Insecurity: Not on file  Transportation Needs: Not on file  Physical Activity: Not on file  Stress: Not on file  Social Connections: Not on file    Family History  Problem Relation Age of Onset   Diabetes Mother    Hypertension Father    Cancer Father    Hypertension Sister     Outpatient Encounter Medications as of 06/14/2021  Medication Sig   Ascorbic Acid (VITAMIN C PO) Take by mouth daily.   B Complex Vitamins (VITAMIN B COMPLEX PO) Take by mouth daily.   Dulaglutide (TRULICITY) 1.5 MG/0.5ML SOPN Inject 1.5 mg into the skin once a week.   glipiZIDE (GLUCOTROL XL) 5 MG 24 hr tablet Take 1 tablet (5 mg total) by mouth daily with breakfast.   metFORMIN (GLUCOPHAGE-XR) 500 MG 24 hr tablet Take 2 tablets (1,000 mg total) by mouth daily with  breakfast.   VITAMIN D PO Take by mouth daily.   No facility-administered encounter medications on file as of 06/14/2021.    ALLERGIES: Allergies  Allergen Reactions   Penicillins Rash    VACCINATION STATUS:  There is no immunization history on file for this patient.  Diabetes She presents for her follow-up diabetic visit. She has type 2 diabetes mellitus. Onset time: Was diagnosed at approx age of 50. Her disease course has been improving. There are no hypoglycemic associated symptoms. Associated symptoms include blurred vision, fatigue and weight loss. Pertinent negatives for diabetes include no polydipsia, no polyphagia and no polyuria. There are no hypoglycemic complications. Symptoms are improving. There are no diabetic complications. Risk factors for coronary artery disease include diabetes mellitus, dyslipidemia, family history, obesity, hypertension and sedentary lifestyle. Current diabetic treatment includes oral agent (dual therapy) (and Trulicity). She is compliant with treatment most of the time. Her weight is decreasing steadily. She is following a generally healthy diet. When asked about meal planning, she reported  none. She has not had a previous visit with a dietitian. She rarely participates in exercise. Her home blood glucose trend is fluctuating dramatically. Her overall blood glucose range is >200 mg/dl. (She presents today with her meter, no logs, showing inconsistent monitoring pattern and recent hyperglycemia.  Her POCT A1c today is 11.1%, improving from her last visit of 13% but nowhere close to goal.  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  She admits she doesn't always eat the healthiest due to being on the go.  ) An ACE inhibitor/angiotensin II receptor blocker is not being taken. She does not see a podiatrist.Eye exam is current.    Review of  systems  Constitutional: + steadily decreasing body weight,  current Body mass index is 37.12 kg/m. , + fatigue-improving, no subjective hyperthermia, no subjective hypothermia Eyes: + blurry vision, no xerophthalmia ENT: no sore throat, no nodules palpated in throat, no dysphagia/odynophagia, no hoarseness Cardiovascular: no chest pain, no shortness of breath, no palpitations, no leg swelling Respiratory: no cough, no shortness of breath Gastrointestinal: no nausea/vomiting/diarrhea Musculoskeletal: no muscle/joint aches Skin: no rashes, no hyperemia Neurological: no tremors, no numbness, no tingling, no dizziness Psychiatric: no depression, no anxiety  Objective:     BP 132/80   Pulse 88   Ht  (1.702 m)   Wt 237 lb (107.5 kg)   BMI 37.12 kg/m   Wt Readings from Last 3 Encounters:  06/14/21 237 lb (107.5 kg)  01/08/21 (P) 244 lb (110.7 kg)  12/04/20 247 lb (112 kg)     BP Readings from Last 3 Encounters:  06/14/21 132/80  12/04/20 (!) 146/84  11/15/20 118/81     Physical Exam- Limited  Constitutional:  Body mass index is 37.12 kg/m. , not in acute distress, tearful today in talking about her health struggles Eyes:  EOMI, no exophthalmos Neck: Supple Cardiovascular: RRR, no murmurs, rubs, or gallops, no edema Respiratory: Adequate breathing efforts, no crackles, rales, rhonchi, or wheezing Musculoskeletal: no gross deformities, strength intact in all four extremities, no gross restriction of joint movements Skin:  no rashes, no hyperemia Neurological: no tremor with outstretched hands    CMP ( most recent) CMP     Component Value Date/Time   NA 134 05/25/2021 1400   K 5.0 05/25/2021 1400   CL 97 05/25/2021 1400   CO2 25 05/25/2021 1400   GLUCOSE 172 (H) 05/25/2021 1400   BUN 9 05/25/2021 1400   CREATININE 0.66 05/25/2021 1400   CALCIUM 10.0 05/25/2021 1400   PROT 7.3 05/25/2021 1400   ALBUMIN 4.0 05/25/2021 1400   AST 24 05/25/2021 1400   ALT 20  05/25/2021 1400   ALKPHOS 115 05/25/2021 1400   BILITOT 0.4 05/25/2021 1400     Diabetic Labs (most recent): Lab Results  Component Value Date   HGBA1C 11.1 06/14/2021   HGBA1C 13.0 (A) 11/15/2020     Lipid Panel ( most recent) Lipid Panel     Component Value Date/Time   CHOL 121 05/25/2021 1400   TRIG 76 05/25/2021 1400   HDL 76 05/25/2021 1400   CHOLHDL 1.6 05/25/2021 1400   LDLCALC 30 05/25/2021 1400   LABVLDL 15 05/25/2021 1400      Lab Results  Component Value Date   TSH 1.070 05/25/2021   FREET4 1.18 05/25/2021           Assessment &  Plan:   1) Type 2 diabetes mellitus with hyperglycemia, with long-term current use of insulin (HCC)  She presents today with her meter, no logs, showing inconsistent monitoring pattern and recent hyperglycemia.  Her POCT A1c today is 11.1%, improving from her last visit of 13% but nowhere close to goal.  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  She admits she doesn't always eat the healthiest due to being on the go.    - Linda Sandoval has currently uncontrolled symptomatic type 2 DM since 50 years of age.  -Recent labs reviewed.  - I had a long discussion with her about the progressive nature of diabetes and the pathology behind its complications. -her diabetes is not currently complicated but she remains at a high risk for more acute and chronic complications which include CAD, CVA, CKD, retinopathy, and neuropathy. These are all discussed in detail with her.  - Nutritional counseling repeated at each appointment due to patients tendency to fall back in to old habits.  - The patient admits there is a room for improvement in their diet and drink choices. -  Suggestion is made for the patient to avoid simple carbohydrates from their diet including Cakes, Sweet Desserts / Pastries, Ice Cream, Soda  (diet and regular), Sweet Tea, Candies, Chips, Cookies, Sweet Pastries, Store Bought Juices, Alcohol in Excess of 1-2 drinks a day, Artificial Sweeteners, Coffee Creamer, and "Sugar-free" Products. This will help patient to have stable blood glucose profile and potentially avoid unintended weight gain.   - I encouraged the patient to switch to unprocessed or minimally processed complex starch and increased protein intake (animal or plant source), fruits, and vegetables.   - Patient is advised to stick to a routine mealtimes to eat 3 meals a day and avoid unnecessary snacks (to snack only to correct hypoglycemia).  - she will be scheduled with Norm Salt, RDN, CDE for diabetes education.  - I have approached her with the following individualized plan to manage  her diabetes and patient agrees:   -Although her glucose readings are not at goal, she is advised to continue current medication regimen of Metformin 1000 mg ER daily with breakfast, Glipizide 5 mg XL daily with breakfast, and Trulicity 1.5 mg SQ weekly.  We talked about sticking to a routine eating pattern and checking glucose to know what her readings are.  We discussed that if she cannot regain control of diabetes on her current regimen, that we may need to add basal insulin to assist.  -she is encouraged to continue monitoring blood glucose twice daily, before breakfast and before bed, and to call the clinic if she has readings less than 70 or greater than 200 for 3 tests in a row.  - Adjustment parameters are given to her for hypo and hyperglycemia in writing.  - Specific targets for  A1c;  LDL, HDL,  and Triglycerides were discussed with the patient.  2) Blood Pressure /Hypertension:  her blood pressure is controlled to target today.  She is not on any antihypertensive medications.  She will be considered for low dose ACE/ARB on subsequent visits if BP is elevated on 3 separate occasions.    3) Lipids/Hyperlipidemia:    Her  recent lipid panel from 05/25/21 shows controlled LDL of 30.  She is not currently on any lipid lowering agents at this time.    4)  Weight/Diet:  her Body mass index is 37.12 kg/m.  -  clearly complicating her diabetes care.   she is a candidate for weight loss. I discussed with her the fact that loss of 5 - 10% of her  current body weight will have the most impact on her diabetes management.  Exercise, and detailed carbohydrates information provided  -  detailed on discharge instructions.  5) Chronic Care/Health Maintenance: -she is not on ACEI/ARB or Statin medications and is encouraged to initiate and continue to follow up with Ophthalmology, Dentist, Podiatrist at least yearly or according to recommendations, and advised to stay away from smoking. I have recommended yearly flu vaccine and pneumonia vaccine at least every 5 years; moderate intensity exercise for up to 150 minutes weekly; and sleep for at least 7 hours a day.  - she is advised to maintain close follow up with Aggie Cosier, MD for primary care needs, as well as her other providers for optimal and coordinated care.       I spent 35 minutes in the care of the patient today including review of labs from CMP, Lipids, Thyroid Function, Hematology (current and previous including abstractions from other facilities); face-to-face time discussing  her blood glucose readings/logs, discussing hypoglycemia and hyperglycemia episodes and symptoms, medications doses, her options of short and long term treatment based on the latest standards of care / guidelines;  discussion about incorporating lifestyle medicine;  and documenting the encounter.    Please refer to Patient Instructions for Blood Glucose Monitoring and Insulin/Medications Dosing Guide"  in media tab for additional information. Please  also refer to " Patient Self Inventory" in the Media  tab for reviewed elements of pertinent patient history.  Linda Sandoval participated in  the discussions, expressed understanding, and voiced agreement with the above plans.  All questions were answered to her satisfaction. she is encouraged to contact clinic should she have any questions or concerns prior to her return visit.   Follow up plan: - Return in about 3 months (around 09/12/2021) for Diabetes F/U with A1c in office, No previsit labs, Bring meter and logs.  Ronny Bacon, [facility] [address] [place], [place] [address] Phone: [phone] Fax: [phone]  06/14/2021, 9:25 AM

## 2021-08-12 ENCOUNTER — Other Ambulatory Visit: Payer: Self-pay | Admitting: Nurse Practitioner

## 2021-08-12 DIAGNOSIS — E1165 Type 2 diabetes mellitus with hyperglycemia: Secondary | ICD-10-CM

## 2021-09-03 ENCOUNTER — Other Ambulatory Visit: Payer: Self-pay

## 2021-09-03 MED ORDER — GLIPIZIDE ER 5 MG PO TB24: 5.0000 mg | ORAL_TABLET | Freq: Every day | ORAL | 3 refills | Status: AC

## 2021-09-13 ENCOUNTER — Ambulatory Visit: Payer: BC Managed Care – PPO | Admitting: Nurse Practitioner

## 2021-09-20 ENCOUNTER — Other Ambulatory Visit: Payer: Self-pay | Admitting: Nurse Practitioner

## 2021-10-01 ENCOUNTER — Ambulatory Visit: Payer: BC Managed Care – PPO | Admitting: Nurse Practitioner

## 2021-10-01 NOTE — Patient Instructions (Incomplete)

## 2021-10-19 ENCOUNTER — Other Ambulatory Visit: Payer: Self-pay | Admitting: Nurse Practitioner

## 2021-11-14 ENCOUNTER — Other Ambulatory Visit: Payer: Self-pay | Admitting: Nurse Practitioner
